# Patient Record
Sex: Female | Born: 1960 | Race: White | Hispanic: No | Marital: Married | Smoking: Never smoker
Health system: Southern US, Community
[De-identification: ages and names within clinical notes are randomized; demographics above are authoritative.]

## PROBLEM LIST (undated history)

## (undated) DIAGNOSIS — J45909 Unspecified asthma, uncomplicated: Secondary | ICD-10-CM

## (undated) DIAGNOSIS — F419 Anxiety disorder, unspecified: Secondary | ICD-10-CM

## (undated) DIAGNOSIS — F329 Major depressive disorder, single episode, unspecified: Secondary | ICD-10-CM

## (undated) DIAGNOSIS — K219 Gastro-esophageal reflux disease without esophagitis: Secondary | ICD-10-CM

## (undated) DIAGNOSIS — F32A Depression, unspecified: Secondary | ICD-10-CM

## (undated) HISTORY — PX: TONSILLECTOMY: SUR1361

## (undated) HISTORY — DX: Anxiety disorder, unspecified: F41.9

## (undated) HISTORY — DX: Gastro-esophageal reflux disease without esophagitis: K21.9

## (undated) HISTORY — DX: Depression, unspecified: F32.A

## (undated) HISTORY — DX: Unspecified asthma, uncomplicated: J45.909

## (undated) HISTORY — PX: OTHER SURGICAL HISTORY: SHX169

---
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
## 2019-01-30 ENCOUNTER — Other Ambulatory Visit: Payer: Self-pay

## 2019-01-30 ENCOUNTER — Encounter: Payer: Self-pay | Admitting: Sports Medicine

## 2019-01-30 ENCOUNTER — Ambulatory Visit (INDEPENDENT_AMBULATORY_CARE_PROVIDER_SITE_OTHER): Payer: 59

## 2019-01-30 ENCOUNTER — Ambulatory Visit (INDEPENDENT_AMBULATORY_CARE_PROVIDER_SITE_OTHER): Payer: 59 | Admitting: Sports Medicine

## 2019-01-30 DIAGNOSIS — M1811 Unilateral primary osteoarthritis of first carpometacarpal joint, right hand: Secondary | ICD-10-CM | POA: Diagnosis not present

## 2019-01-30 DIAGNOSIS — M5412 Radiculopathy, cervical region: Secondary | ICD-10-CM | POA: Diagnosis not present

## 2019-01-30 DIAGNOSIS — M5030 Other cervical disc degeneration, unspecified cervical region: Secondary | ICD-10-CM | POA: Insufficient documentation

## 2019-01-30 MED ORDER — CELECOXIB 200 MG PO CAPS: ORAL_CAPSULE | ORAL | 2 refills | Status: DC

## 2019-01-30 NOTE — Assessment & Plan Note (Signed)
History of epidurals in the past, she has had conservative measures, new x-ray, new MRI, we will likely refer to Dr. Francesco Runner for left C6-C7 epidural after the work-up is complete. Her last MRI was about 58 years old, last epidural was 3 years ago with Dr. Nelva Bush.

## 2019-01-30 NOTE — Progress Notes (Signed)
Subjective:    CC: Neck pain, thumb pain  HPI:  This is a pleasant 58 year old female, she has a history of a left CMC arthroplasty in the distant past, she is now having pain in her right hand, thumb basal joint, moderate, persistent, localized without radiation.  In addition she has known cervical DDD, historically gets epidurals with Dr. Ethelene Hal, has not had one in 3 years, last MRI was over 58 years old.  Symptoms are moderate, persistent, localized in the neck and radiation down the left arm with cramping over the dorsum of the hand.  She has thus failed greater than 6 weeks of conservative measures, MRI will be for interventional planning.  I reviewed the past medical history, family history, social history, surgical history, and allergies today and no changes were needed.  Please see the problem list section below in epic for further details.  Past Medical History: Past Medical History:  Diagnosis Date  . Anxiety   . Asthma   . Depression   . GERD (gastroesophageal reflux disease)    Past Surgical History: Past Surgical History:  Procedure Laterality Date  . thumb basal Left   . TONSILLECTOMY     Social History: Social History   Socioeconomic History  . Marital status: Not on file    Spouse name: Not on file  . Number of children: Not on file  . Years of education: Not on file  . Highest education level: Not on file  Occupational History  . Not on file  Social Needs  . Financial resource strain: Not on file  . Food insecurity    Worry: Not on file    Inability: Not on file  . Transportation needs    Medical: Not on file    Non-medical: Not on file  Tobacco Use  . Smoking status: Never Smoker  . Smokeless tobacco: Never Used  Substance and Sexual Activity  . Alcohol use: Not Currently  . Drug use: Never  . Sexual activity: Not on file  Lifestyle  . Physical activity    Days per week: Not on file    Minutes per session: Not on file  . Stress: Not on file   Relationships  . Social Musician on phone: Not on file    Gets together: Not on file    Attends religious service: Not on file    Active member of club or organization: Not on file    Attends meetings of clubs or organizations: Not on file    Relationship status: Not on file  Other Topics Concern  . Not on file  Social History Narrative  . Not on file   Family History: Family History  Problem Relation Age of Onset  . Colon polyps Mother   . Cancer Paternal Grandmother    Allergies: Allergies  Allergen Reactions  . Prednisone     Pain when taking a deep breath    Medications: See med rec.  Review of Systems: No headache, visual changes, nausea, vomiting, diarrhea, constipation, dizziness, abdominal pain, skin rash, fevers, chills, night sweats, swollen lymph nodes, weight loss, chest pain, body aches, joint swelling, muscle aches, shortness of breath, mood changes, visual or auditory hallucinations.  Objective:    General: Well Developed, well nourished, and in no acute distress.  Neuro: Alert and oriented x3, extra-ocular muscles intact, sensation grossly intact.  HEENT: Normocephalic, atraumatic, pupils equal round reactive to light, neck supple, no masses, no lymphadenopathy, thyroid nonpalpable.  Skin: Warm  and dry, no rashes noted.  Cardiac: Regular rate and rhythm, no murmurs rubs or gallops.  Respiratory: Clear to auscultation bilaterally. Not using accessory muscles, speaking in full sentences.  Abdominal: Soft, nontender, nondistended, positive bowel sounds, no masses, no organomegaly.  Musculoskeletal: Shoulder, elbow, wrist, hip, knee, ankle stable, and with full range of motion.  Procedure: Real-time Ultrasound Guided injection of the right first [facility] Device: Samsung HS60  Verbal informed consent obtained.  Time-out conducted.  Noted no overlying erythema, induration, or other signs of local infection.  Skin prepped in a sterile fashion.  Local  anesthesia: Topical Ethyl chloride.  With sterile technique and under real time ultrasound guidance:  1/2 cc lidocaine, 1/2 cc Kenalog 40 injected easily  completed without difficulty  Pain immediately resolved suggesting accurate placement of the medication.  Advised to call if fevers/chills, erythema, induration, drainage, or persistent bleeding.  Images permanently stored and available for review in the ultrasound unit.  Impression: Technically successful ultrasound guided injection.  Impression and Recommendations:    The patient was counselled, risk factors were discussed, anticipatory guidance given.  Primary osteoarthritis of right first CMC joint, status post left CMC arthroplasty Celebrex, and exercises, injection as above, return to see me in a month.  Radiculitis of left cervical region History of epidurals in the past, she has had conservative measures, new x-ray, new MRI, we will likely refer to Dr. Francesco Runner for left C6-C7 epidural after the work-up is complete. Her last MRI was about 58 years old, last epidural was 3 years ago with Dr. Nelva Bush.   ___________________________________________ Gwen Her. Dianah Field, M.D., ABFM., CAQSM. Primary Care and Sports Medicine [place] [facility]  Adjunct Professor of [place] [employer]

## 2019-01-30 NOTE — Assessment & Plan Note (Signed)
Celebrex, and exercises, injection as above, return to see me in a month.

## 2019-01-31 ENCOUNTER — Telehealth: Payer: Self-pay

## 2019-01-31 NOTE — Telephone Encounter (Signed)
Thank you, let me know when I can be of assistance here.

## 2019-01-31 NOTE — Telephone Encounter (Signed)
Called [employer], case was denied due to not specific types and dates of conservative treatment.   I called patient and she is going to contact Dr Nelva Bush office and have them send [place] a fax with tried and failed medications and therapy.   Awaiting fax to send to insurance for reconsideration. Fax to [phone]

## 2019-02-04 NOTE — Telephone Encounter (Signed)
Called Dr Nelva Bush office and spoke with Anderson Malta. She was agreeable to fax me the last office visit note from 10/2016.  Will obtain as much info as possible from this note to submit to insurance for reconsideration.   FYI to Dr T that this is still being work on.

## 2019-02-04 NOTE — Telephone Encounter (Signed)
Gave follow up call to patient to see if she has obtained any records. Patient has not received call back from Dr Nelva Bush office.   I will also attempt to call them and get this information with patient's consent

## 2019-02-04 NOTE — Telephone Encounter (Signed)
Thank you for updating me,Just let me know if I need to do a peer to peer.

## 2019-02-04 NOTE — Telephone Encounter (Signed)
Reconsideration faxed. Will keep provider updated

## 2019-02-05 NOTE — Telephone Encounter (Signed)
Called, they told me peer to peer was not an option for this case any longer, they said to contact [employer] directly for an appeal on the number on the back of the insurance card.  This is BS.

## 2019-02-05 NOTE — Telephone Encounter (Signed)
Case denied. Please call for peer to peer and we can get her in this weekend for scan.   Peer to peer # [phone]  Case # [id number]  Member ID: [id number]

## 2019-02-06 ENCOUNTER — Encounter: Payer: Self-pay | Admitting: Sports Medicine

## 2019-02-06 NOTE — Telephone Encounter (Signed)
Dr T called to do peer to peer, was told that was not an option. Called [employer], appeal is only option and can take up to 30 days. Only way to contact appeals department through [facility] is via fax. Will let Dr T know and ask he write note to send with office notes for appeal. Fax # [phone]

## 2019-02-06 NOTE — Telephone Encounter (Signed)
Letter written, faxed to the above number.

## 2019-02-07 NOTE — Telephone Encounter (Signed)
Thank you :)

## 2019-02-17 NOTE — Telephone Encounter (Signed)
There is still refusing to cover this MRI, my advice to her is to contact in [place] managed care patient assistance program at [phone] to file a complaint.

## 2019-02-17 NOTE — Telephone Encounter (Signed)
Called pt, no ans and VM full

## 2019-02-27 ENCOUNTER — Ambulatory Visit (INDEPENDENT_AMBULATORY_CARE_PROVIDER_SITE_OTHER): Payer: 59 | Admitting: Sports Medicine

## 2019-02-27 ENCOUNTER — Encounter: Payer: Self-pay | Admitting: Sports Medicine

## 2019-02-27 ENCOUNTER — Other Ambulatory Visit: Payer: Self-pay

## 2019-02-27 DIAGNOSIS — M5412 Radiculopathy, cervical region: Secondary | ICD-10-CM

## 2019-02-27 MED ORDER — GABAPENTIN 300 MG PO CAPS: ORAL_CAPSULE | ORAL | 3 refills | Status: DC

## 2019-02-27 NOTE — Progress Notes (Signed)
Subjective:    CC: Follow-up  HPI: This is a very pleasant 58 year old female, she has known cervical DDD with bilateral radiculitis, previously well controlled with epidurals at an outside facility.  She is having recurrence of pain, she tried oral medications, therapy, nothing is working, we ordered a new MRI to reevaluate her cervical spine and for epidural planning but unfortunately it was denied by her insurance company  I reviewed the past medical history, family history, social history, surgical history, and allergies today and no changes were needed.  Please see the problem list section below in epic for further details.  Past Medical History: Past Medical History:  Diagnosis Date  . Anxiety   . Asthma   . Depression   . GERD (gastroesophageal reflux disease)    Past Surgical History: Past Surgical History:  Procedure Laterality Date  . thumb basal Left   . TONSILLECTOMY     Social History: Social History   Socioeconomic History  . Marital status: Married    Spouse name: Not on file  . Number of children: Not on file  . Years of education: Not on file  . Highest education level: Not on file  Occupational History  . Not on file  Tobacco Use  . Smoking status: Never Smoker  . Smokeless tobacco: Never Used  Substance and Sexual Activity  . Alcohol use: Not Currently  . Drug use: Never  . Sexual activity: Not on file  Other Topics Concern  . Not on file  Social History Narrative  . Not on file   Social Determinants of Health   Financial Resource Strain:   . Difficulty of Paying Living Expenses: Not on file  Food Insecurity:   . Worried About Charity fundraiser in the Last Year: Not on file  . Ran Out of Food in the Last Year: Not on file  Transportation Needs:   . Lack of Transportation (Medical): Not on file  . Lack of Transportation (Non-Medical): Not on file  Physical Activity:   . Days of Exercise per Week: Not on file  . Minutes of Exercise per  Session: Not on file  Stress:   . Feeling of Stress : Not on file  Social Connections:   . Frequency of Communication with Friends and Family: Not on file  . Frequency of Social Gatherings with Friends and Family: Not on file  . Attends Religious Services: Not on file  . Active Member of Clubs or Organizations: Not on file  . Attends Archivist Meetings: Not on file  . Marital Status: Not on file   Family History: Family History  Problem Relation Age of Onset  . Colon polyps Mother   . Cancer Paternal Grandmother    Allergies: Allergies  Allergen Reactions  . Prednisone     Pain when taking a deep breath    Medications: See med rec.  Review of Systems: No fevers, chills, night sweats, weight loss, chest pain, or shortness of breath.   Objective:    General: Well Developed, well nourished, and in no acute distress.  Neuro: Alert and oriented x3, extra-ocular muscles intact, sensation grossly intact.  HEENT: Normocephalic, atraumatic, pupils equal round reactive to light, neck supple, no masses, no lymphadenopathy, thyroid nonpalpable.  Skin: Warm and dry, no rashes. Cardiac: Regular rate and rhythm, no murmurs rubs or gallops, no lower extremity edema.  Respiratory: Clear to auscultation bilaterally. Not using accessory muscles, speaking in full sentences.  Impression and Recommendations:  Radiculitis of left cervical region Still having a great deal of difficulties getting the MRI approved. She has done well with cervical epidurals with Dr. Ethelene Hal approximately a decade ago. Her MRI was also about a decade ago so we do need new MRI but unfortunately her insurance company has not covered it. We will continue to fight for this, in the meantime I am going to add gabapentin, she is having bilateral left worse than right cervical radicular symptoms described as cramping. Return to see me in 1 month.  Of note she would like to transfer to this practice.  Her  ultimate goal is to either consolidate or minimize her psychotropics.   ___________________________________________ Ihor Austin. Benjamin Stain, M.D., ABFM., CAQSM. Primary Care and Sports Medicine [place] [facility]  Adjunct Professor of Family Medicine  [employer]

## 2019-02-27 NOTE — Assessment & Plan Note (Addendum)
Still having a great deal of difficulties getting the MRI approved. She has done well with cervical epidurals with Dr. Nelva Bush approximately a decade ago. Her MRI was also about a decade ago so we do need new MRI but unfortunately her insurance company has not covered it. We will continue to fight for this, in the meantime I am going to add gabapentin, she is having bilateral left worse than right cervical radicular symptoms described as cramping. Return to see me in 1 month.  Of note she would like to transfer to this practice.  Her ultimate goal is to either consolidate or minimize her psychotropics.

## 2019-03-27 ENCOUNTER — Ambulatory Visit: Payer: 59 | Admitting: Sports Medicine

## 2019-04-14 ENCOUNTER — Encounter: Payer: Self-pay | Admitting: Sports Medicine

## 2019-04-14 ENCOUNTER — Ambulatory Visit (INDEPENDENT_AMBULATORY_CARE_PROVIDER_SITE_OTHER): Payer: 59 | Admitting: Sports Medicine

## 2019-04-14 ENCOUNTER — Other Ambulatory Visit: Payer: Self-pay

## 2019-04-14 DIAGNOSIS — M1811 Unilateral primary osteoarthritis of first carpometacarpal joint, right hand: Secondary | ICD-10-CM | POA: Diagnosis not present

## 2019-04-14 DIAGNOSIS — M5412 Radiculopathy, cervical region: Secondary | ICD-10-CM

## 2019-04-14 MED ORDER — TRAMADOL HCL 50 MG PO TABS: 50.0000 mg | ORAL_TABLET | Freq: Three times a day (TID) | ORAL | 0 refills | Status: DC | PRN

## 2019-04-14 NOTE — Assessment & Plan Note (Signed)
And returns, she is a pleasant 59 year old female, she is having continued left worse than right cervical radicular pain, she does have an MRI from 2018 that shows degenerative disc disease with disc space narrowing at the C5-C6 level. At this point she has failed greater than 6 weeks of conservative measures, we are going to proceed with cervical epidurals that she has done well in the past with Dr. Ethelene Hal. Also would add some tramadol for pain in the meantime, I would like to see her back 1 month after the epidural.

## 2019-04-14 NOTE — Assessment & Plan Note (Signed)
I injected her back on January 30, 2019, she is now having a recurrence of pain at the left thumb basal joint. Tramadol will help, but she is now a candidate for [facility] arthroplasty, she had the procedure on the left and is doing well here.

## 2019-04-14 NOTE — Progress Notes (Signed)
    Procedures performed today:    None.  Independent interpretation of tests performed by another provider:   I have reviewed her cervical spine MRI from [facility] in 2018, the dominant finding is a small disc bulge at C4-C5, she has a larger disc bulge at C5-C6 with degenerative disc space narrowing, a posterior disc osteophyte complex causing mild bilateral neural foraminal stenosis.  Impression and Recommendations:    Radiculitis of left cervical region And returns, she is a pleasant 59 year old female, she is having continued left worse than right cervical radicular pain, she does have an MRI from 2018 that shows degenerative disc disease with disc space narrowing at the C5-C6 level. At this point she has failed greater than 6 weeks of conservative measures, we are going to proceed with cervical epidurals that she has done well in the past with Dr. Ethelene Hal. Also would add some tramadol for pain in the meantime, I would like to see her back 1 month after the epidural.  Primary osteoarthritis of right first [facility] joint, status post left [facility] arthroplasty I injected her back on January 30, 2019, she is now having a recurrence of pain at the left thumb basal joint. Tramadol will help, but she is now a candidate for [facility] arthroplasty, she had the procedure on the left and is doing well here.    ___________________________________________ Ihor Austin. Benjamin Stain, M.D., ABFM., CAQSM. Primary Care and Sports Medicine [place] [facility]  Adjunct Instructor of Family Medicine  [employer] [facility] [employer] [employer]

## 2020-02-02 ENCOUNTER — Ambulatory Visit (INDEPENDENT_AMBULATORY_CARE_PROVIDER_SITE_OTHER): Payer: 59 | Admitting: Sports Medicine

## 2020-02-02 ENCOUNTER — Ambulatory Visit (INDEPENDENT_AMBULATORY_CARE_PROVIDER_SITE_OTHER): Payer: 59

## 2020-02-02 DIAGNOSIS — M2550 Pain in unspecified joint: Secondary | ICD-10-CM | POA: Diagnosis not present

## 2020-02-02 DIAGNOSIS — M1811 Unilateral primary osteoarthritis of first carpometacarpal joint, right hand: Secondary | ICD-10-CM

## 2020-02-02 DIAGNOSIS — M5412 Radiculopathy, cervical region: Secondary | ICD-10-CM

## 2020-02-02 MED ORDER — GABAPENTIN 300 MG PO CAPS: 300.0000 mg | ORAL_CAPSULE | Freq: Three times a day (TID) | ORAL | 3 refills | Status: DC

## 2020-02-02 NOTE — Assessment & Plan Note (Signed)
Multiple joint aches and pains, interphalangeal joints, adding full rheumatoid work-up.

## 2020-02-02 NOTE — Assessment & Plan Note (Signed)
MRI from 2018 showed DDD with disc space narrowing at C5-C6, she has done well with gabapentin. She does have some cramping in her middle finger, likely radicular. I am going to pull the trigger for rheumatoid work-up considering her IP joint pain but I am also going to increase her gabapentin to 600 mg 3 times daily.

## 2020-02-02 NOTE — Progress Notes (Signed)
    Procedures performed today:    Procedure: Real-time Ultrasound Guided injection of the right first [facility] Device: Samsung HS60  Verbal informed consent obtained.  Time-out conducted.  Noted no overlying erythema, induration, or other signs of local infection.  Skin prepped in a sterile fashion.  Local anesthesia: Topical Ethyl chloride.  With sterile technique and under real time ultrasound guidance:  1/2 cc lidocaine, 1/2 cc kenalog 40 injected easily.   Completed without difficulty  Advised to call if fevers/chills, erythema, induration, drainage, or persistent bleeding.  Images permanently stored and available for review in PACS.  Impression: Technically successful ultrasound guided injection.  Independent interpretation of notes and tests performed by another provider:   None.  Brief History, Exam, Impression, and Recommendations:    Primary osteoarthritis of right first CMC joint, status post left CMC arthroplasty Repeat for CMC injection today, last injected in February. Return as needed.  Radiculitis of left cervical region MRI from 2018 showed DDD with disc space narrowing at C5-C6, she has done well with gabapentin. She does have some cramping in her middle finger, likely radicular. I am going to pull the trigger for rheumatoid work-up considering her IP joint pain but I am also going to increase her gabapentin to 600 mg 3 times daily.  Polyarthralgia Multiple joint aches and pains, interphalangeal joints, adding full rheumatoid work-up.    ___________________________________________ Ihor Austin. Benjamin Stain, M.D., ABFM., CAQSM. Primary Care and Sports Medicine [place] [facility]  Adjunct Instructor of Family Medicine  [employer]

## 2020-02-02 NOTE — Assessment & Plan Note (Signed)
Repeat for CMC injection today, last injected in February. Return as needed.

## 2020-02-05 LAB — RHEUMATOID FACTOR (IGA, IGG, IGM)
Rheumatoid Factor (IgA): <5 U (ref <=6)
Rheumatoid Factor (IgG): 7 U — ABNORMAL HIGH (ref <=6)
Rheumatoid Factor (IgM): 6 U (ref <=6)

## 2020-02-05 LAB — LUPUS(12) PANEL
Anti Nuclear Antibody (ANA): POSITIVE — AB
C3 Complement: 207 mg/dL — ABNORMAL HIGH (ref 83–193)
C4 Complement: 32 mg/dL (ref 15–57)
ENA SM Ab Ser-aCnc: <1 AI
Rheumatoid fact SerPl-aCnc: <14 IU/mL (ref <14)
Ribosomal P Protein Ab: <1 AI
SM/RNP: <1 AI
SSA (Ro) (ENA) Antibody, IgG: <1 AI
SSB (La) (ENA) Antibody, IgG: <1 AI
Scleroderma (Scl-70) (ENA) Antibody, IgG: <1 AI
Thyroperoxidase Ab SerPl-aCnc: 1 IU/mL (ref <9)
ds DNA Ab: 1 IU/mL

## 2020-02-05 LAB — SEDIMENTATION RATE: Sed Rate: 38 mm/h — ABNORMAL HIGH (ref 0–30)

## 2020-02-05 LAB — CBC WITH DIFFERENTIAL/PLATELET
Absolute Monocytes: 782 cells/uL (ref 200–950)
Basophils Absolute: 109 cells/uL (ref 0–200)
Basophils Relative: 1.1 %
Eosinophils Absolute: 277 cells/uL (ref 15–500)
Eosinophils Relative: 2.8 %
HCT: 41.1 % (ref 35.0–45.0)
Hemoglobin: 14 g/dL (ref 11.7–15.5)
Lymphs Abs: 2287 cells/uL (ref 850–3900)
MCH: 27.1 pg (ref 27.0–33.0)
MCHC: 34.1 g/dL (ref 32.0–36.0)
MCV: 79.5 fL — ABNORMAL LOW (ref 80.0–100.0)
MPV: 9.4 fL (ref 7.5–12.5)
Monocytes Relative: 7.9 %
Neutro Abs: 6445 cells/uL (ref 1500–7800)
Neutrophils Relative %: 65.1 %
Platelets: 335 10*3/uL (ref 140–400)
RBC: 5.17 10*6/uL — ABNORMAL HIGH (ref 3.80–5.10)
RDW: 12.2 % (ref 11.0–15.0)
Total Lymphocyte: 23.1 %
WBC: 9.9 10*3/uL (ref 3.8–10.8)

## 2020-02-05 LAB — COMPREHENSIVE METABOLIC PANEL
AG Ratio: 1.5 (calc) (ref 1.0–2.5)
ALT: 26 U/L (ref 6–29)
AST: 18 U/L (ref 10–35)
Albumin: 4.5 g/dL (ref 3.6–5.1)
Alkaline phosphatase (APISO): 81 U/L (ref 37–153)
BUN: 14 mg/dL (ref 7–25)
CO2: 28 mmol/L (ref 20–32)
Calcium: 10.1 mg/dL (ref 8.6–10.4)
Chloride: 98 mmol/L (ref 98–110)
Creat: 0.88 mg/dL (ref 0.50–1.05)
Globulin: 3.1 g/dL (calc) (ref 1.9–3.7)
Glucose, Bld: 103 mg/dL (ref 65–139)
Potassium: 4.4 mmol/L (ref 3.5–5.3)
Sodium: 137 mmol/L (ref 135–146)
Total Bilirubin: 0.3 mg/dL (ref 0.2–1.2)
Total Protein: 7.6 g/dL (ref 6.1–8.1)

## 2020-02-05 LAB — ANTI-NUCLEAR AB-TITER (ANA TITER): ANA Titer 1: 1:80 {titer} — ABNORMAL HIGH

## 2020-02-05 LAB — URIC ACID: Uric Acid, Serum: 7.1 mg/dL — ABNORMAL HIGH (ref 2.5–7.0)

## 2020-02-05 LAB — CYCLIC CITRUL PEPTIDE ANTIBODY, IGG: Cyclic Citrullin Peptide Ab: <16 UNITS

## 2020-02-05 LAB — CK: Total CK: 53 U/L (ref 29–143)

## 2020-02-16 ENCOUNTER — Ambulatory Visit: Payer: 59 | Admitting: Family Medicine

## 2020-02-24 ENCOUNTER — Ambulatory Visit (INDEPENDENT_AMBULATORY_CARE_PROVIDER_SITE_OTHER): Payer: 59 | Admitting: Sports Medicine

## 2020-02-24 ENCOUNTER — Other Ambulatory Visit: Payer: Self-pay

## 2020-02-24 DIAGNOSIS — M2550 Pain in unspecified joint: Secondary | ICD-10-CM | POA: Diagnosis not present

## 2020-02-24 DIAGNOSIS — M1811 Unilateral primary osteoarthritis of first carpometacarpal joint, right hand: Secondary | ICD-10-CM | POA: Diagnosis not present

## 2020-02-24 NOTE — Progress Notes (Signed)
    Procedures performed today:    None.  Independent interpretation of notes and tests performed by another provider:   None.  Brief History, Exam, Impression, and Recommendations:    Primary osteoarthritis of right first CMC joint, status post left CMC arthroplasty We did a right first [place] injection at the last visit, doing well today.  Polyarthralgia And did have some laboratory abnormalities including a positive ANA with negative confirmatory testing, a mildly elevated ESR, and a very mildly elevated rheumatoid factor, these are so mild that I really do not think she has true rheumatoid arthritis, I think she falls in the gray area between normal and full autoimmune disease. Because she is doing well we are to hold off on referral to rheumatology and/or steroid treatment or methotrexate. If she has recurrence of symptoms we may retest her and then potentially refer her to rheumatology.    ___________________________________________ Gwen Her. Dianah Field, M.D., ABFM., CAQSM. Primary Care and [place] Instructor of [employer] [facility] of Medicine

## 2020-02-24 NOTE — Assessment & Plan Note (Signed)
We did a right first CMC injection at the last visit, doing well today.

## 2020-02-24 NOTE — Assessment & Plan Note (Signed)
And did have some laboratory abnormalities including a positive ANA with negative confirmatory testing, a mildly elevated ESR, and a very mildly elevated rheumatoid factor, these are so mild that I really do not think she has true rheumatoid arthritis, I think she falls in the gray area between normal and full autoimmune disease. Because she is doing well we are to hold off on referral to rheumatology and/or steroid treatment or methotrexate. If she has recurrence of symptoms we may retest her and then potentially refer her to rheumatology.

## 2020-03-02 ENCOUNTER — Ambulatory Visit: Payer: 59 | Admitting: Sports Medicine

## 2020-12-24 ENCOUNTER — Ambulatory Visit: Payer: 59 | Admitting: Sports Medicine

## 2020-12-24 ENCOUNTER — Ambulatory Visit (INDEPENDENT_AMBULATORY_CARE_PROVIDER_SITE_OTHER): Payer: 59

## 2020-12-24 ENCOUNTER — Other Ambulatory Visit: Payer: Self-pay

## 2020-12-24 DIAGNOSIS — M5030 Other cervical disc degeneration, unspecified cervical region: Secondary | ICD-10-CM

## 2020-12-24 DIAGNOSIS — M542 Cervicalgia: Secondary | ICD-10-CM

## 2020-12-24 DIAGNOSIS — G8929 Other chronic pain: Secondary | ICD-10-CM | POA: Diagnosis not present

## 2020-12-24 NOTE — Assessment & Plan Note (Signed)
This is a very pleasant 60 year old female with known disc space narrowing C5-C6, historically had some cramping down the left arm to the middle finger, she has epidurals in the past that worked really well. Lately for greater than 6 weeks she has been doing home conditioning exercises that I have given her in the past, oral over-the-counter analgesics, without sufficient relief. Because she has thus failed greater than 6 weeks of physician directed conservative treatment and has axial neck pain, as well as tenderness in the right periscapular region where going to proceed with an updated x-ray, updated MRI hopefully to be done this weekend and once I see the results we will send her for a cervical epidural with Dr. Laurian Brim.

## 2020-12-24 NOTE — Progress Notes (Signed)
    Procedures performed today:    None.  Independent interpretation of notes and tests performed by another provider:   None.  Brief History, Exam, Impression, and Recommendations:    DDD (degenerative disc disease), cervical This is a very pleasant 61 year old female with known disc space narrowing C5-C6, historically had some cramping down the left arm to the middle finger, she has epidurals in the past that worked really well. Lately for greater than 6 weeks she has been doing home conditioning exercises that I have given her in the past, oral over-the-counter analgesics, without sufficient relief. Because she has thus failed greater than 6 weeks of physician directed conservative treatment and has axial neck pain, as well as tenderness in the right periscapular region where going to proceed with an updated x-ray, updated MRI hopefully to be done this weekend and once I see the results we will send her for a cervical epidural with Dr. Laurian Brim.    ___________________________________________ Ihor Austin. Benjamin Stain, M.D., ABFM., CAQSM. Primary Care and Sports Medicine [place] [facility]  Adjunct Instructor of Family Medicine  [employer]

## 2020-12-28 ENCOUNTER — Other Ambulatory Visit: Payer: Self-pay | Admitting: Sports Medicine

## 2020-12-28 MED ORDER — TRIAZOLAM 0.25 MG PO TABS: ORAL_TABLET | ORAL | 0 refills | Status: DC

## 2020-12-29 DIAGNOSIS — M5030 Other cervical disc degeneration, unspecified cervical region: Secondary | ICD-10-CM

## 2020-12-30 MED ORDER — TRIAZOLAM 0.25 MG PO TABS: ORAL_TABLET | ORAL | 0 refills | Status: DC

## 2021-01-01 ENCOUNTER — Ambulatory Visit (INDEPENDENT_AMBULATORY_CARE_PROVIDER_SITE_OTHER): Payer: 59

## 2021-01-01 ENCOUNTER — Other Ambulatory Visit: Payer: Self-pay

## 2021-01-01 DIAGNOSIS — M4802 Spinal stenosis, cervical region: Secondary | ICD-10-CM | POA: Diagnosis not present

## 2021-01-01 DIAGNOSIS — M5030 Other cervical disc degeneration, unspecified cervical region: Secondary | ICD-10-CM | POA: Diagnosis not present

## 2021-01-04 NOTE — Addendum Note (Signed)
Addended by: Monica Becton on: 01/04/2021 02:13 PM   Modules accepted: Orders

## 2021-12-12 ENCOUNTER — Encounter: Payer: Self-pay | Admitting: Sports Medicine

## 2021-12-12 ENCOUNTER — Ambulatory Visit (INDEPENDENT_AMBULATORY_CARE_PROVIDER_SITE_OTHER): Payer: 59

## 2021-12-12 ENCOUNTER — Ambulatory Visit (INDEPENDENT_AMBULATORY_CARE_PROVIDER_SITE_OTHER): Payer: 59 | Admitting: Sports Medicine

## 2021-12-12 DIAGNOSIS — M5030 Other cervical disc degeneration, unspecified cervical region: Secondary | ICD-10-CM

## 2021-12-12 DIAGNOSIS — M1811 Unilateral primary osteoarthritis of first carpometacarpal joint, right hand: Secondary | ICD-10-CM

## 2021-12-12 NOTE — Assessment & Plan Note (Signed)
This is a very pleasant 61 year old female, she has thumb basal joint arthritis last injected approximately 2 years ago, now having a recurrence of pain, repeat injection today, return to see me as needed.

## 2021-12-12 NOTE — Progress Notes (Signed)
    Procedures performed today:    Procedure: Real-time Ultrasound Guided injection of the right thumb basal joint Device: Samsung HS60  Verbal informed consent obtained.  Time-out conducted.  Noted no overlying erythema, induration, or other signs of local infection.  Skin prepped in a sterile fashion.  Local anesthesia: Topical Ethyl chloride.  With sterile technique and under real time ultrasound guidance: Arthritic joint noted, 1/2 cc lidocaine, 1/2 cc kenalog 40 injected easily. Completed without difficulty  Advised to call if fevers/chills, erythema, induration, drainage, or persistent bleeding.  Images permanently stored and available for review in PACS.  Impression: Technically successful ultrasound guided injection.  Independent interpretation of notes and tests performed by another provider:   None.  Brief History, Exam, Impression, and Recommendations:    Primary osteoarthritis of right first CMC joint, status post left [facility]MC arthroplasty This is a very pleasant 61 year old female, she has thumb basal joint arthritis last injected approximately 2 years ago, now having a recurrence of pain, repeat injection today, return to see me as needed.  DDD (degenerative disc disease), cervical [place] is also having recurrence of neck pain with cervical radiculitis, she does desire another cervical epidural. She last did this with Dr. Francesco Runner so we will send her for repeat.    ____________________________________________ Gwen Her. Dianah Field, M.D., ABFM., CAQSM., AME. Primary Care and Sports Medicine [place] [facility]  Adjunct Professor of [place] of [facility]  Risk manager

## 2021-12-12 NOTE — Assessment & Plan Note (Signed)
Jo Horton is also having recurrence of neck pain with cervical radiculitis, she does desire another cervical epidural. She last did this with Dr. Francesco Runner so we will send her for repeat.

## 2022-07-27 ENCOUNTER — Other Ambulatory Visit (INDEPENDENT_AMBULATORY_CARE_PROVIDER_SITE_OTHER): Payer: 59

## 2022-07-27 ENCOUNTER — Ambulatory Visit: Payer: 59 | Admitting: Sports Medicine

## 2022-07-27 DIAGNOSIS — M5030 Other cervical disc degeneration, unspecified cervical region: Secondary | ICD-10-CM | POA: Diagnosis not present

## 2022-07-27 DIAGNOSIS — M1811 Unilateral primary osteoarthritis of first carpometacarpal joint, right hand: Secondary | ICD-10-CM

## 2022-07-27 MED ORDER — GABAPENTIN 600 MG PO TABS: 600.0000 mg | ORAL_TABLET | Freq: Three times a day (TID) | ORAL | 3 refills | Status: AC

## 2022-07-27 NOTE — Progress Notes (Signed)
    Procedures performed today:    Procedure: Real-time Ultrasound Guided injection of the right thumb basal joint Device: Samsung HS60  Verbal informed consent obtained.  Time-out conducted.  Noted no overlying erythema, induration, or other signs of local infection.  Skin prepped in a sterile fashion.  Local anesthesia: Topical Ethyl chloride.  With sterile technique and under real time ultrasound guidance: Arthritic joint noted, 1/2 cc lidocaine, 1/2 cc kenalog 40 injected easily. Completed without difficulty  Advised to call if fevers/chills, erythema, induration, drainage, or persistent bleeding.  Images permanently stored and available for review in PACS.  Impression: Technically successful ultrasound guided injection.  Independent interpretation of notes and tests performed by another provider:   None.  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    ____________________________________________ Ihor Austin. Benjamin Stain, M.D., ABFM., CAQSM., AME. Primary Care and Sports Medicine [place] [facility]  Adjunct Professor of Family Medicine  [place] of [facility] [employer]  Restaurant manager, fast food

## 2022-07-27 NOTE — Assessment & Plan Note (Signed)
This is a very 62 year old female, known thumb basal joint osteoarthritis, last injected October 2023, recurrence of pain, repeat injection today, she does use topical Voltaren, she will continue this 4 times daily and we will go ahead and go up on her gabapentin which seems to help significantly.

## 2022-10-25 ENCOUNTER — Ambulatory Visit: Payer: 59 | Admitting: Sports Medicine

## 2022-10-25 ENCOUNTER — Other Ambulatory Visit (INDEPENDENT_AMBULATORY_CARE_PROVIDER_SITE_OTHER): Payer: 59

## 2022-10-25 ENCOUNTER — Encounter: Payer: Self-pay | Admitting: Sports Medicine

## 2022-10-25 DIAGNOSIS — M1811 Unilateral primary osteoarthritis of first carpometacarpal joint, right hand: Secondary | ICD-10-CM

## 2022-10-25 DIAGNOSIS — M5030 Other cervical disc degeneration, unspecified cervical region: Secondary | ICD-10-CM

## 2022-10-25 NOTE — Assessment & Plan Note (Signed)
Known cervical DDD, she did have an epidural with Dr. Laurian Brim last done at the end of 2022. Now having recurrence of pain, left-sided, ordering repeat epidural with Dr. Laurian Brim.

## 2022-10-25 NOTE — Progress Notes (Signed)
    Procedures performed today:    Procedure: Real-time Ultrasound Guided injection of the right first carpometacarpal joint Device: Samsung HS60  Verbal informed consent obtained.  Time-out conducted.  Noted no overlying erythema, induration, or other signs of local infection.  Skin prepped in a sterile fashion.  Local anesthesia: Topical Ethyl chloride.  With sterile technique and under real time ultrasound guidance: Noted arthritic joint, 0.5 cc lidocaine, 0.5 cc kenalog 40 injected easily. Completed without difficulty  Advised to call if fevers/chills, erythema, induration, drainage, or persistent bleeding.  Images permanently stored and available for review in PACS.  Impression: Technically successful ultrasound guided injection.  Independent interpretation of notes and tests performed by another provider:   None.  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 Amanda Pea. Of note she is status post left [facility] arthroplasty.  DDD (degenerative disc disease), cervical Known cervical DDD, she did have an epidural with Dr. Laurian Brim last done at the end of 2022. Now having recurrence of pain, left-sided, ordering repeat epidural with Dr. Laurian Brim.    ____________________________________________ Ihor Austin. Benjamin Stain, M.D., ABFM., CAQSM., AME. Primary Care and Sports Medicine [place] [facility]  Adjunct Professor of Family Medicine  [place] of [facility] [employer]  Restaurant manager, fast food

## 2022-10-25 NOTE — Assessment & Plan Note (Signed)
Very pleasant 62 year old female, known thumb basal joint osteoarthritis, last injection was May 2024, she is now having recurrence of pain, she also reports she only got 1 month of relief, we did a repeat right first [facility] injection with ultrasound guidance, I do think it is time for her to get a consult with Dr. Amanda Pea. Of note she is status post left [facility] arthroplasty.

## 2022-11-03 ENCOUNTER — Encounter: Payer: Self-pay | Admitting: Sports Medicine

## 2023-02-23 ENCOUNTER — Ambulatory Visit: Payer: 59 | Admitting: Sports Medicine

## 2023-02-26 ENCOUNTER — Ambulatory Visit: Payer: 59 | Admitting: Sports Medicine

## 2023-02-26 ENCOUNTER — Other Ambulatory Visit (INDEPENDENT_AMBULATORY_CARE_PROVIDER_SITE_OTHER): Payer: 59

## 2023-02-26 DIAGNOSIS — M1811 Unilateral primary osteoarthritis of first carpometacarpal joint, right hand: Secondary | ICD-10-CM

## 2023-02-26 NOTE — Progress Notes (Signed)
    Procedures performed today:    Procedure: Real-time Ultrasound Guided injection of the right first carpometacarpal joint Device: Samsung HS60  Verbal informed consent obtained.  Time-out conducted.  Noted no overlying erythema, induration, or other signs of local infection.  Skin prepped in a sterile fashion.  Local anesthesia: Topical Ethyl chloride.  With sterile technique and under real time ultrasound guidance: Noted arthritic joint, 0.5 cc lidocaine, 0.5 cc kenalog 40 injected easily. Completed without difficulty  Advised to call if fevers/chills, erythema, induration, drainage, or persistent bleeding.  Images permanently stored and available for review in PACS.  Impression: Technically successful ultrasound guided injection.  Independent interpretation of notes and tests performed by another provider:   None.  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 She is established with Dr. Amanda Pea.    ____________________________________________ Ihor Austin. Benjamin Stain, M.D., ABFM., CAQSM., AME. Primary Care and Sports Medicine [place] [facility]  Adjunct Professor of Family Medicine  [place] of [facility] [employer]  Restaurant manager, fast food

## 2023-02-26 NOTE — Assessment & Plan Note (Signed)
History of left CMC arthroplasty, we did a right CMC injection today, last done about 3 months ago. She is established with Dr. Amanda Pea.

## 2023-05-23 ENCOUNTER — Telehealth: Payer: Self-pay

## 2023-05-23 NOTE — Telephone Encounter (Signed)
 Copied from CRM [id number]. Topic: Appointments - Scheduling Inquiry for Clinic >> May 23, 2023  8:04 AM Nyra Capes wrote: Reason for CRM: patient called in wanting to schedule an injection in right thumb. Patient Phone [phone] ok to detailed message

## 2023-05-24 ENCOUNTER — Ambulatory Visit: Admitting: Sports Medicine

## 2023-05-24 ENCOUNTER — Other Ambulatory Visit (INDEPENDENT_AMBULATORY_CARE_PROVIDER_SITE_OTHER): Payer: Self-pay

## 2023-05-24 ENCOUNTER — Encounter: Payer: Self-pay | Admitting: Sports Medicine

## 2023-05-24 DIAGNOSIS — M1811 Unilateral primary osteoarthritis of first carpometacarpal joint, right hand: Secondary | ICD-10-CM | POA: Diagnosis not present

## 2023-05-24 MED ORDER — TRIAMCINOLONE ACETONIDE 40 MG/ML IJ SUSP
20.0000 mg | Freq: Once | INTRAMUSCULAR | Status: AC
Start: 2023-05-24 — End: 2023-05-24
  Administered 2023-05-24: 20 mg via INTRAMUSCULAR

## 2023-05-24 NOTE — Progress Notes (Signed)
    Procedures performed today:    Procedure: Real-time Ultrasound Guided injection of the right first carpometacarpal joint Device: Samsung HS60  Verbal informed consent obtained.  Time-out conducted.  Noted no overlying erythema, induration, or other signs of local infection.  Skin prepped in a sterile fashion.  Local anesthesia: Topical Ethyl chloride.  With sterile technique and under real time ultrasound guidance: Noted arthritic joint, 0.5 cc lidocaine, 0.5 cc kenalog 40 injected easily. Completed without difficulty  Advised to call if fevers/chills, erythema, induration, drainage, or persistent bleeding.  Images permanently stored and available for review in PACS.  Impression: Technically successful ultrasound guided injection.  Independent interpretation of notes and tests performed by another provider:   None.  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 She is established with Dr. Amanda Pea. Return to see me as needed.    ____________________________________________ Ihor Austin. Benjamin Stain, M.D., ABFM., CAQSM., AME. Primary Care and Sports Medicine [place] [facility]  Adjunct Professor of Family Medicine  [place] of [facility] [employer]  Restaurant manager, fast food

## 2023-05-24 NOTE — Assessment & Plan Note (Addendum)
 History of left CMC arthroplasty, last right first [facility] injection was in December, repeated today. She is established with Dr. Amanda Pea. Return to see me as needed.

## 2023-08-21 ENCOUNTER — Other Ambulatory Visit (INDEPENDENT_AMBULATORY_CARE_PROVIDER_SITE_OTHER)

## 2023-08-21 ENCOUNTER — Encounter: Payer: Self-pay | Admitting: Sports Medicine

## 2023-08-21 ENCOUNTER — Ambulatory Visit: Admitting: Sports Medicine

## 2023-08-21 DIAGNOSIS — M1811 Unilateral primary osteoarthritis of first carpometacarpal joint, right hand: Secondary | ICD-10-CM

## 2023-08-21 MED ORDER — TRIAMCINOLONE ACETONIDE 40 MG/ML IJ SUSP
20.0000 mg | Freq: Once | INTRAMUSCULAR | Status: AC
  Administered 2023-08-21: 20 mg via INTRAMUSCULAR

## 2023-08-21 NOTE — Addendum Note (Signed)
 Addended by: OLIVA-AVELLANEDA, Ambika Zettlemoyer L on: 08/21/2023 04:21 PM   Modules accepted: Orders

## 2023-08-21 NOTE — Assessment & Plan Note (Signed)
 Very pleasant 63 year old female, known right CMC arthritis, history of left CMC arthroplasty. Last injection was just under 3 months ago, repeated today, we do need to refer her off to surgery due to waning efficacy of the injections.

## 2023-08-21 NOTE — Progress Notes (Signed)
    Procedures performed today:    Procedure: Real-time Ultrasound Guided injection of the right first carpometacarpal joint Device: Samsung HS60  Verbal informed consent obtained.  Time-out conducted.  Noted no overlying erythema, induration, or other signs of local infection.  Skin prepped in a sterile fashion.  Local anesthesia: Topical Ethyl chloride.  With sterile technique and under real time ultrasound guidance: Noted arthritic joint, 0.5 cc lidocaine, 0.5 cc kenalog  40 injected easily. Completed without difficulty  Advised to call if fevers/chills, erythema, induration, drainage, or persistent bleeding.  Images permanently stored and available for review in PACS.  Impression: Technically successful ultrasound guided injection.  Independent interpretation of notes and tests performed by another provider:   None.  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 Last injection was just under 3 months ago, repeated today, we do need to refer her off to surgery due to waning efficacy of the injections.    ____________________________________________ Debby PARAS. Curtis, M.D., ABFM., CAQSM., AME. Primary Care and Sports Medicine [place] [facility]  Adjunct Professor of [facility] [employer]  [employer]  Restaurant manager, fast food

## 2023-08-22 IMAGING — DX DG CERVICAL SPINE COMPLETE 4+V
6 series · 6 of 6 positions shown · non-contrast
Comparison: January 30, 2019

CLINICAL DATA: Chronic neck pain which radiates to right scapula

EXAM:
CERVICAL SPINE - COMPLETE 4+ VIEW

[c-spine lat]
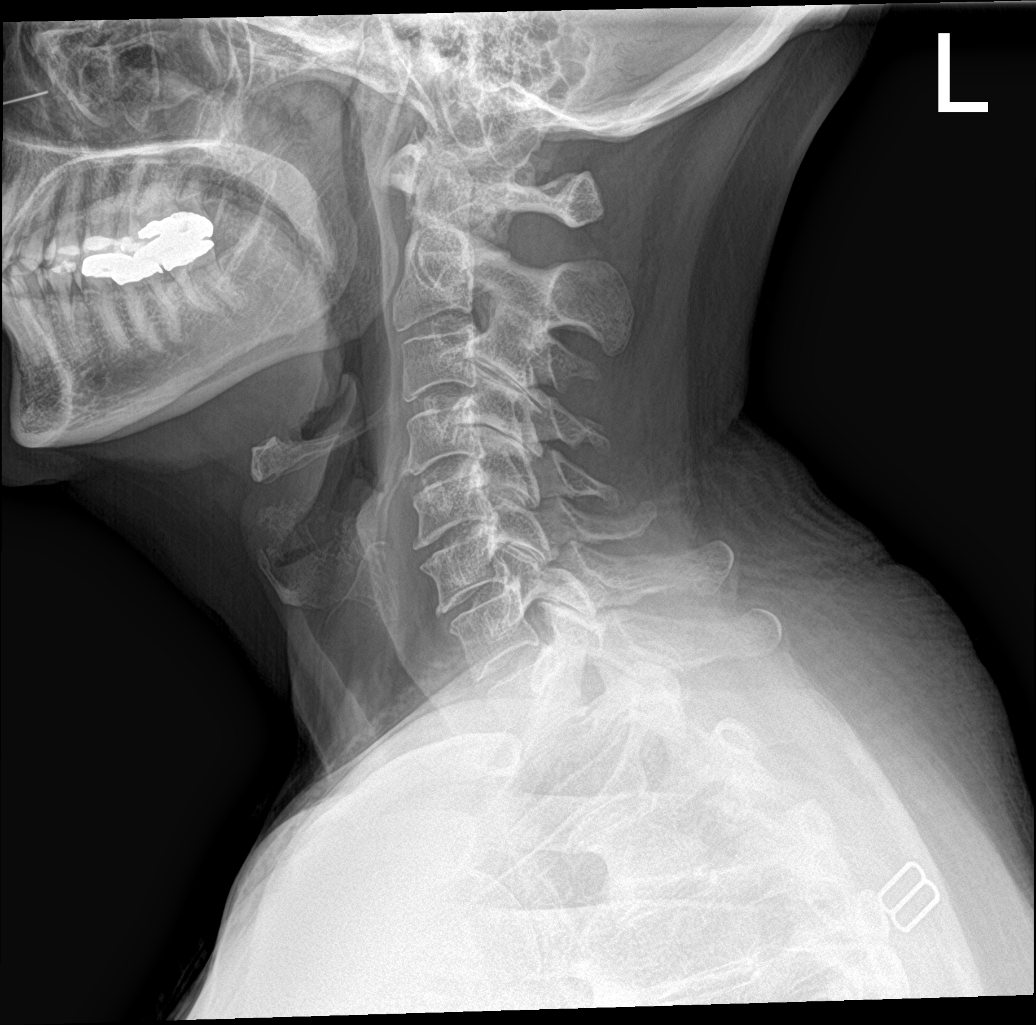

[c-spine obl (1 of 2)]
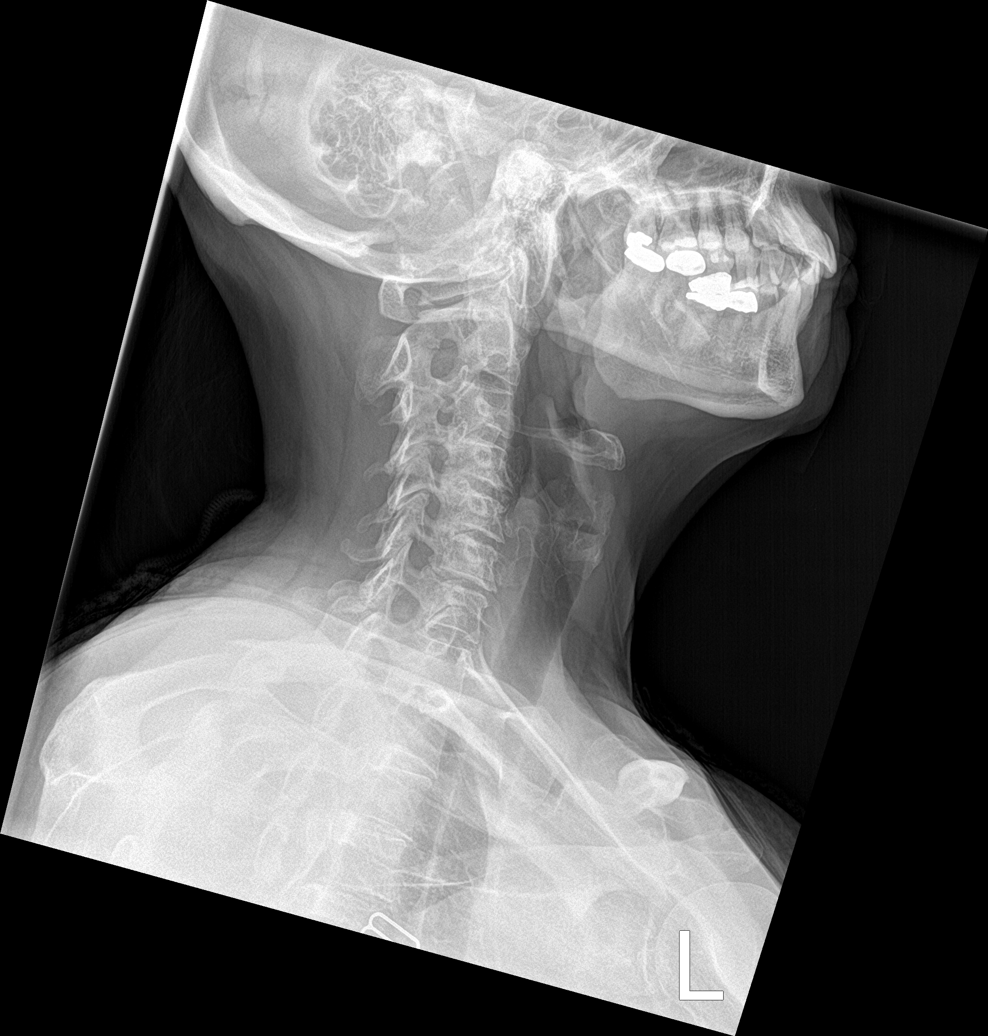

[c-spine obl (2 of 2)]
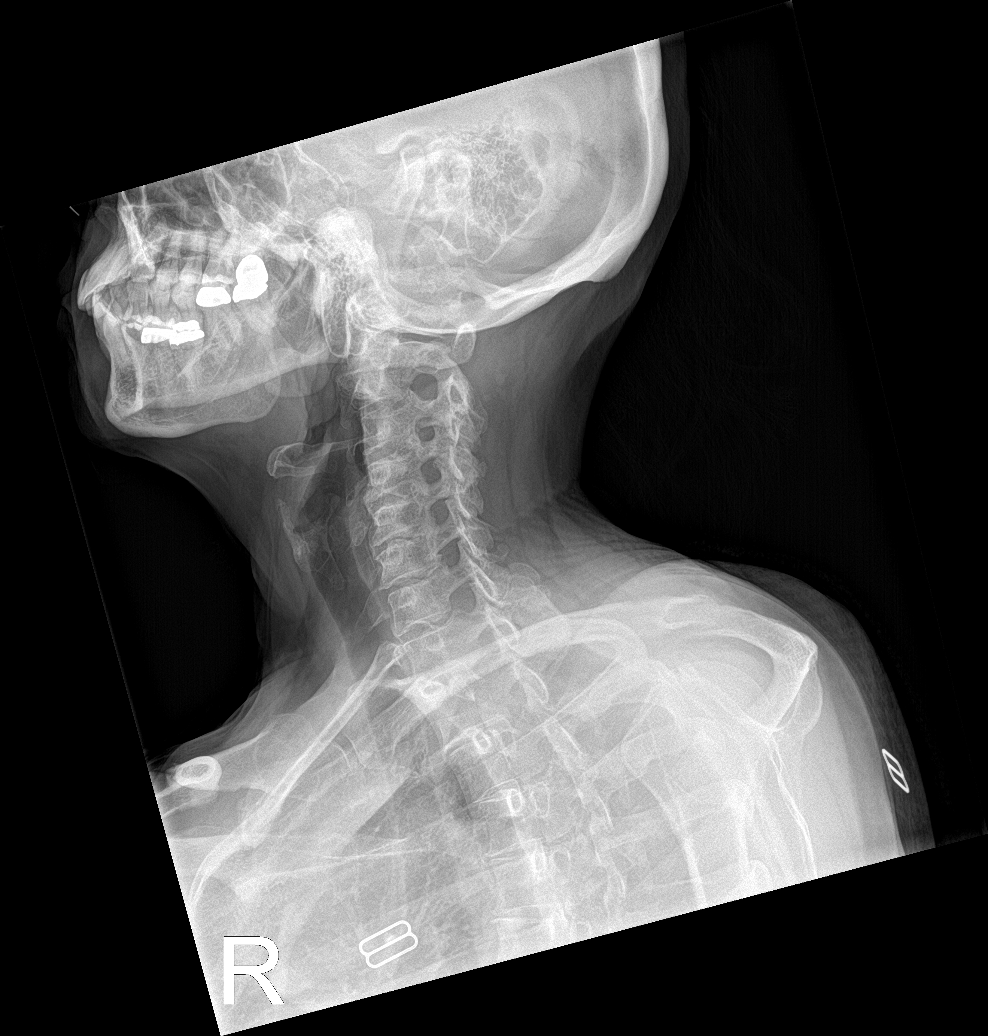

[c-spine ap]
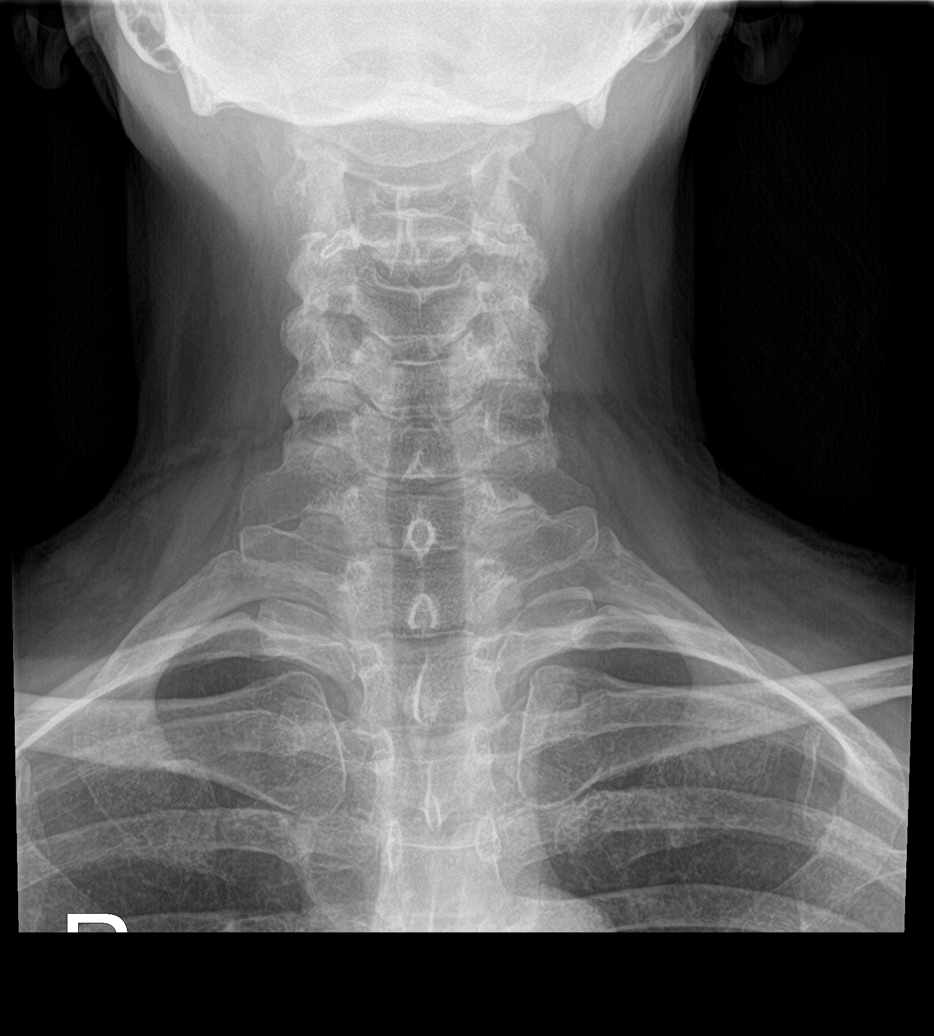

[c-spine open mouth]
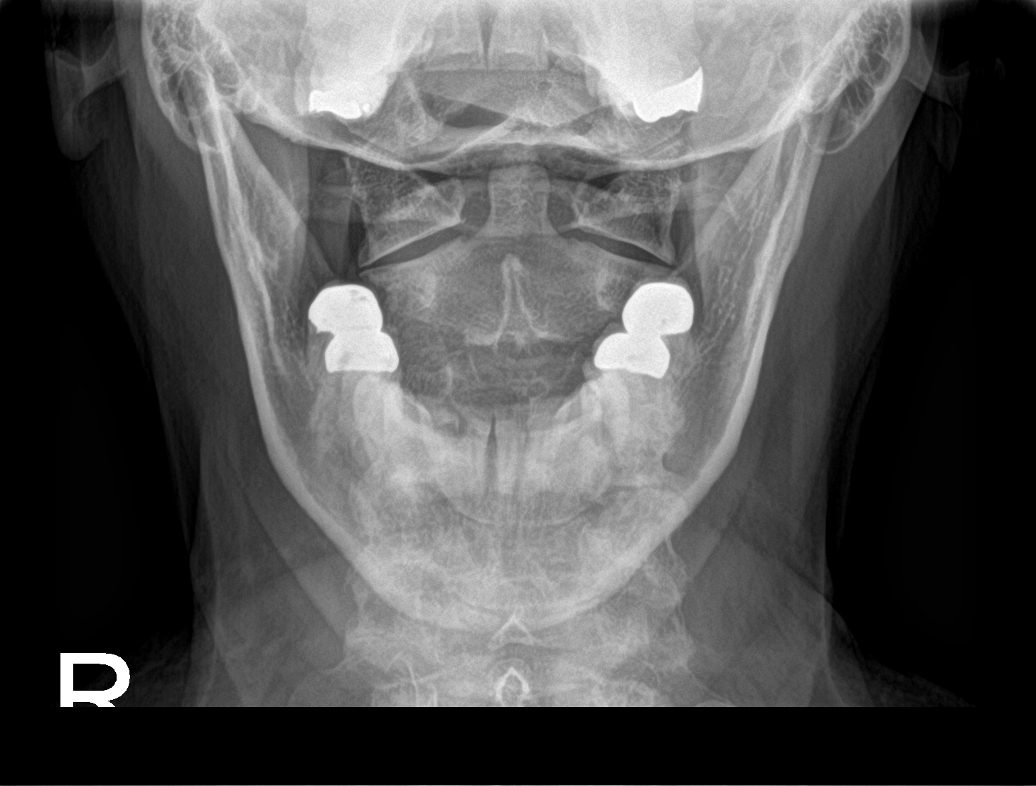

[c-spine swimmers]
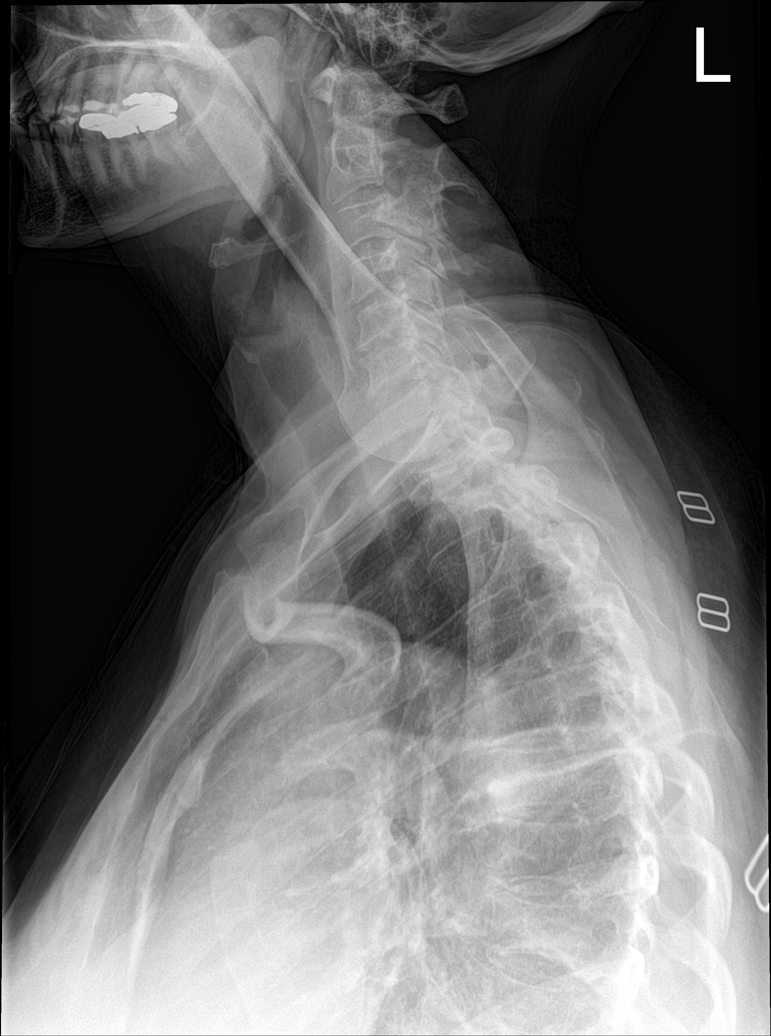

[6 of 6 positions shown; findings below may reference images not displayed]

FINDINGS: Seven cervical type vertebral bodies are visualized on the lateral
view. There is no evidence of cervical spine fracture or
prevertebral soft tissue swelling. The dens and lateral masses are
intact. Alignment is normal. Similar lower cervical predominant
discogenic disease most notably at C5-C6 without significant neural
foraminal narrowing. Dental hardware.
IMPRESSION: Similar lower cervical predominant discogenic disease without
significant neural foraminal narrowing.

No acute osseous abnormality.

## 2023-10-30 ENCOUNTER — Encounter: Payer: Self-pay | Admitting: Sports Medicine
